# Patient Record
Sex: Female | Born: 2000 | Race: White | Hispanic: No | Marital: Single | Smoking: Never smoker
Health system: Southern US, Community
[De-identification: ages and names within clinical notes are randomized; demographics above are authoritative.]

---
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
## 2019-03-12 ENCOUNTER — Other Ambulatory Visit: Payer: Self-pay | Admitting: Family Medicine

## 2019-03-12 ENCOUNTER — Ambulatory Visit
Admission: RE | Admit: 2019-03-12 | Discharge: 2019-03-12 | Disposition: A | Discharge status: Home / Self Care | Payer: BC Managed Care – PPO | Source: Ambulatory Visit | Attending: Family Medicine | Admitting: Family Medicine

## 2019-03-12 ENCOUNTER — Other Ambulatory Visit: Payer: Self-pay

## 2019-03-12 DIAGNOSIS — M25562 Pain in left knee: Secondary | ICD-10-CM

## 2019-03-12 DIAGNOSIS — M25462 Effusion, left knee: Secondary | ICD-10-CM

## 2019-03-14 ENCOUNTER — Other Ambulatory Visit: Payer: Self-pay

## 2019-03-14 ENCOUNTER — Ambulatory Visit
Admission: RE | Admit: 2019-03-14 | Discharge: 2019-03-14 | Disposition: A | Discharge status: Home / Self Care | Payer: BC Managed Care – PPO | Source: Ambulatory Visit | Attending: Family Medicine | Admitting: Family Medicine

## 2019-03-14 DIAGNOSIS — M25462 Effusion, left knee: Secondary | ICD-10-CM

## 2019-03-14 DIAGNOSIS — M25562 Pain in left knee: Secondary | ICD-10-CM | POA: Diagnosis present

## 2019-03-19 ENCOUNTER — Other Ambulatory Visit: Payer: Self-pay

## 2019-03-19 ENCOUNTER — Other Ambulatory Visit (INDEPENDENT_AMBULATORY_CARE_PROVIDER_SITE_OTHER): Payer: BC Managed Care – PPO

## 2019-03-19 DIAGNOSIS — T148XXA Other injury of unspecified body region, initial encounter: Secondary | ICD-10-CM

## 2019-03-19 DIAGNOSIS — M898X9 Other specified disorders of bone, unspecified site: Secondary | ICD-10-CM

## 2019-03-20 LAB — COMPREHENSIVE METABOLIC PANEL
ALT: 17 IU/L (ref 0–32)
AST: 25 IU/L (ref 0–40)
Albumin/Globulin Ratio: 2.1 (ref 1.2–2.2)
Albumin: 4.6 g/dL (ref 3.9–5.0)
Alkaline Phosphatase: 72 IU/L (ref 39–117)
BUN/Creatinine Ratio: 16 (ref 9–23)
BUN: 13 mg/dL (ref 6–20)
Bilirubin Total: 0.4 mg/dL (ref 0.0–1.2)
CO2: 23 mmol/L (ref 20–29)
Calcium: 9.2 mg/dL (ref 8.7–10.2)
Chloride: 103 mmol/L (ref 96–106)
Creatinine, Ser: 0.79 mg/dL (ref 0.57–1.00)
GFR calc Af Amer: 125 mL/min/{1.73_m2} (ref >59)
GFR calc non Af Amer: 109 mL/min/{1.73_m2} (ref >59)
Globulin, Total: 2.2 g/dL (ref 1.5–4.5)
Glucose: 92 mg/dL (ref 65–99)
Potassium: 4.4 mmol/L (ref 3.5–5.2)
Sodium: 139 mmol/L (ref 134–144)
Total Protein: 6.8 g/dL (ref 6.0–8.5)

## 2019-03-20 LAB — CBC WITH DIFFERENTIAL/PLATELET
Basophils Absolute: 0 10*3/uL (ref 0.0–0.2)
Basos: 1 %
EOS (ABSOLUTE): 0.1 10*3/uL (ref 0.0–0.4)
Eos: 1 %
Hematocrit: 40.4 % (ref 34.0–46.6)
Hemoglobin: 13.2 g/dL (ref 11.1–15.9)
Immature Grans (Abs): 0 10*3/uL (ref 0.0–0.1)
Immature Granulocytes: 0 %
Lymphocytes Absolute: 1.6 10*3/uL (ref 0.7–3.1)
Lymphs: 29 %
MCH: 29.4 pg (ref 26.6–33.0)
MCHC: 32.7 g/dL (ref 31.5–35.7)
MCV: 90 fL (ref 79–97)
Monocytes Absolute: 0.3 10*3/uL (ref 0.1–0.9)
Monocytes: 6 %
Neutrophils Absolute: 3.5 10*3/uL (ref 1.4–7.0)
Neutrophils: 63 %
Platelets: 255 10*3/uL (ref 150–450)
RBC: 4.49 x10E6/uL (ref 3.77–5.28)
RDW: 12.5 % (ref 11.7–15.4)
WBC: 5.5 10*3/uL (ref 3.4–10.8)

## 2019-03-20 LAB — IRON AND TIBC
Iron Saturation: 78 % (ref 15–55)
Iron: 246 ug/dL — ABNORMAL HIGH (ref 27–159)
Total Iron Binding Capacity: 315 ug/dL (ref 250–450)
UIBC: 69 ug/dL — ABNORMAL LOW (ref 131–425)

## 2019-03-20 LAB — FERRITIN: Ferritin: 23 ng/mL (ref 15–77)

## 2019-03-20 LAB — VITAMIN D 25 HYDROXY (VIT D DEFICIENCY, FRACTURES): Vit D, 25-Hydroxy: 33.8 ng/mL (ref 30.0–100.0)

## 2019-03-22 ENCOUNTER — Telehealth: Payer: Self-pay | Admitting: Family Medicine

## 2019-03-22 NOTE — Telephone Encounter (Signed)
Spoke with the patient and discussed lab results. Vitamin D level was requested by Dr. Ardine Eng and CBCw/diff and iron studies were requested by the patient and her mother.  Patient doesn't take a Vitamin D supplement but does take an iron supplement. She is unsure of the dose she takes. She recently stopped dairy as well.   Patient states that she has already sent the results to her primary care physician back home to review. She plans on having a virtual meeting with her soon. She will discuss any adjustments in her iron supplement and possibly adding a Vitamin D3 Supplement with Ca+ with her PMD. Follow-up as needed if any other labs requested by PMD. Patient in agreement and has no further questions.

## 2020-10-14 ENCOUNTER — Institutional Professional Consult (permissible substitution): Payer: PRIVATE HEALTH INSURANCE | Admitting: Internal Medicine

## 2021-05-21 IMAGING — MR MR KNEE*L* W/O CM
7 series · 40 of 40 positions shown · non-contrast
Comparison: 11/01/2017 MRI.

CLINICAL DATA: Knee trauma. History of ACL reconstruction 1 year
ago with tightness in the knee and limited range of motion after
playing lacrosse.

EXAM:
MRI OF THE LEFT KNEE WITHOUT CONTRAST
TECHNIQUE: Multiplanar, multisequence MR imaging of the knee was performed. No
intravenous contrast was administered.

[Series 8: T2 fat-sat · axial · left · 4.0mm · 0.50mm/px · z∈[-57,+67]mm · 7 of 26 slices shown (1 of 3)]
[im 1/26]
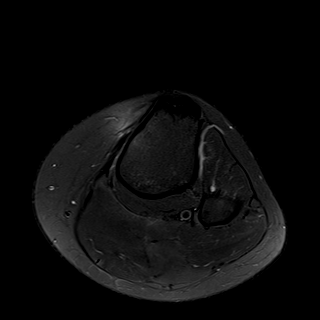
[im 5/26]
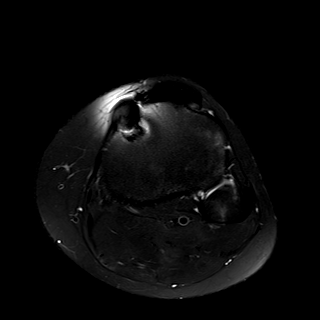
[im 9/26]
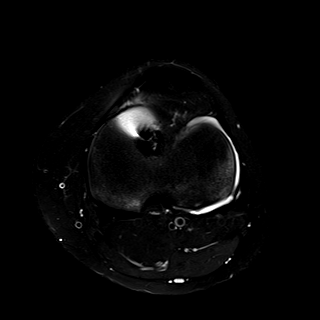
[im 13/26]
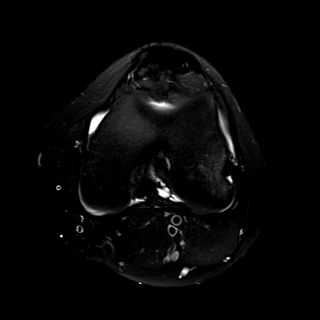
[im 17/26]
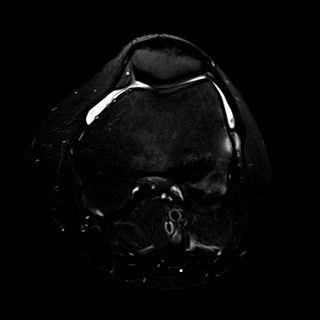
[im 21/26]
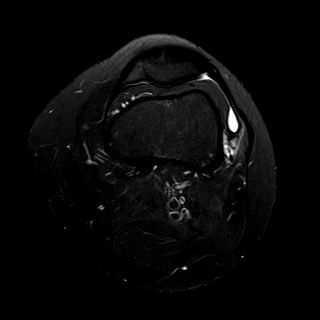
[im 26/26]
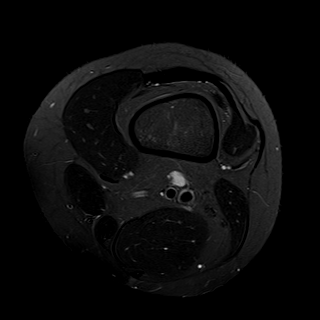

[Series 9: T2 fat-sat · coronal · left · 4.0mm · 0.59mm/px · 7 of 29 slices shown (2 of 3)]
[im 1/29]
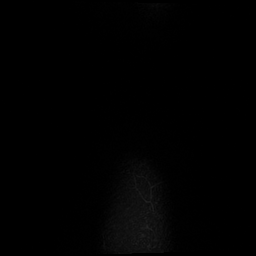
[im 5/29]
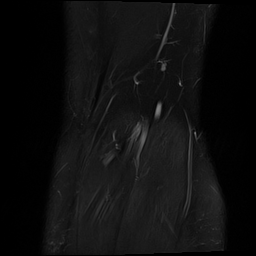
[im 10/29]
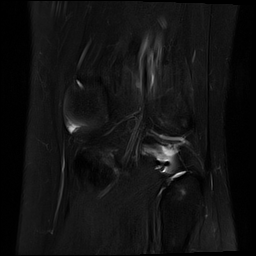
[im 15/29]
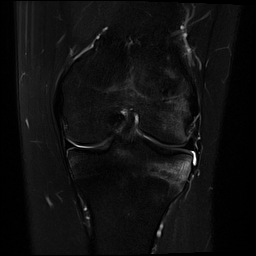
[im 19/29]
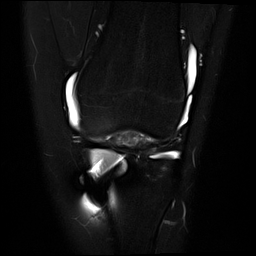
[im 24/29]
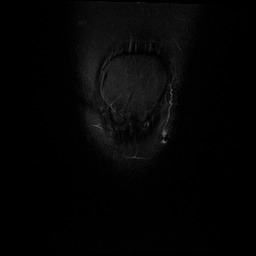
[im 29/29]
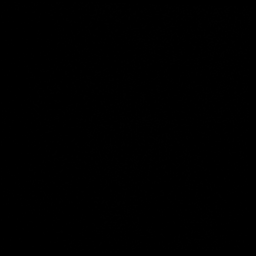

[Series 10: T1 · coronal · left · 4.0mm · 0.59mm/px · 6 of 30 slices shown]
[im 1/30]
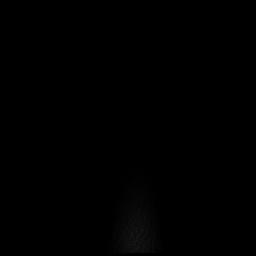
[im 6/30]
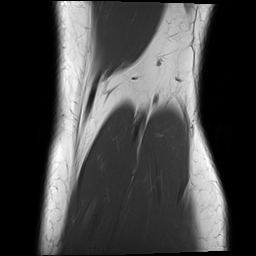
[im 12/30]
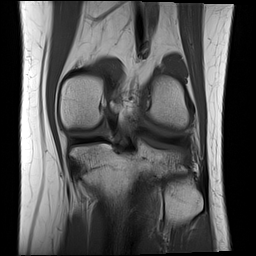
[im 18/30]
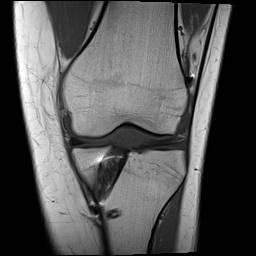
[im 24/30]
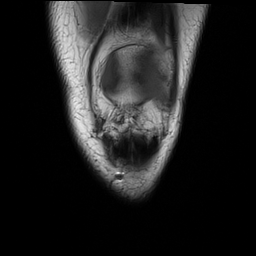
[im 30/30]
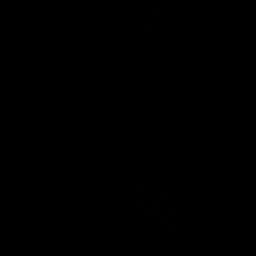

[Series 11: PD fat-sat · sagittal · left · 3.0mm · 0.59mm/px · 6 of 30 slices shown (1 of 2)]
[im 1/30]
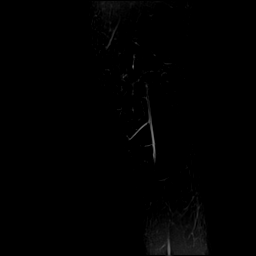
[im 6/30]
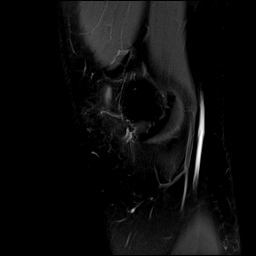
[im 12/30]
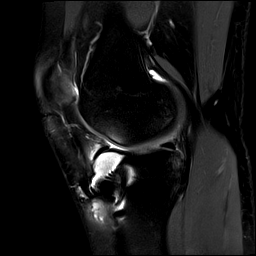
[im 18/30]
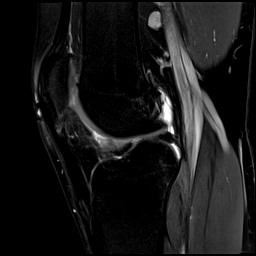
[im 24/30]
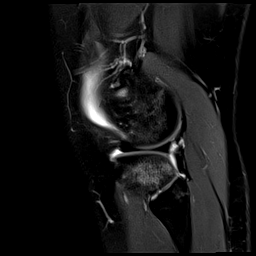
[im 30/30]
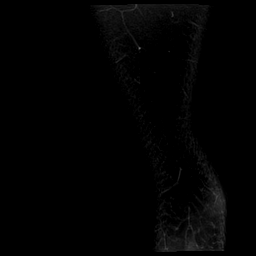

[Series 12: PD fat-sat · coronal · left · 4.0mm · 0.59mm/px · 6 of 30 slices shown (2 of 2)]
[im 1/30]
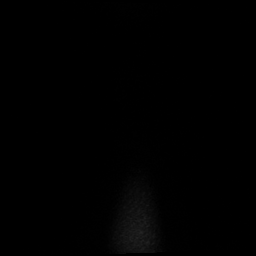
[im 6/30]
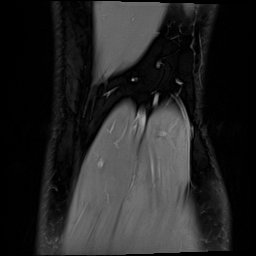
[im 12/30]
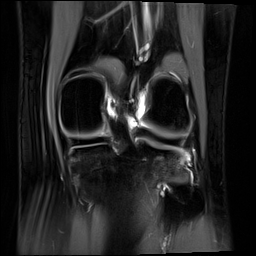
[im 18/30]
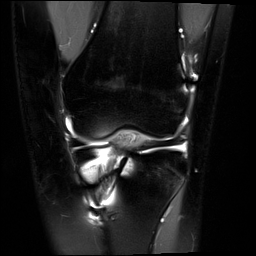
[im 24/30]
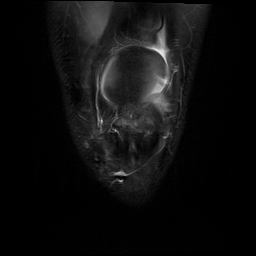
[im 30/30]
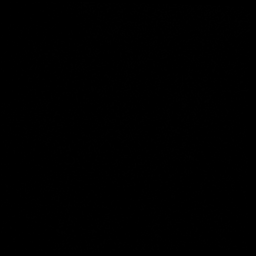

[Series 13: T2 fat-sat · sagittal · left · 3.0mm · 0.59mm/px · 6 of 30 slices shown (3 of 3)]
[im 1/30]
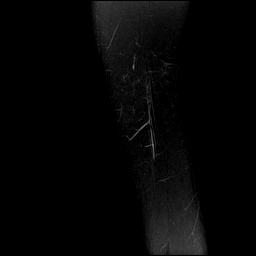
[im 6/30]
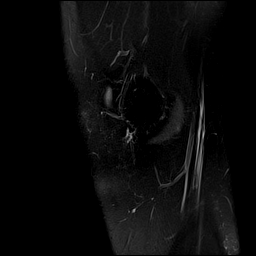
[im 12/30]
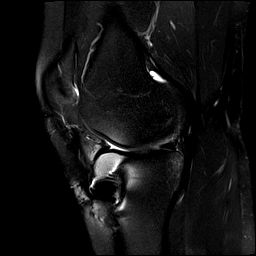
[im 18/30]
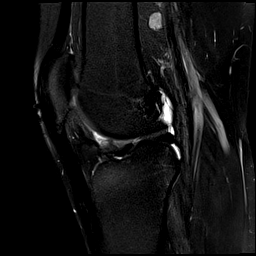
[im 24/30]
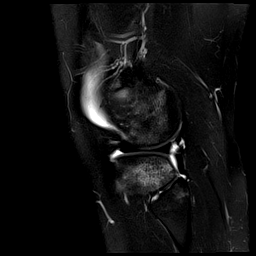
[im 30/30]
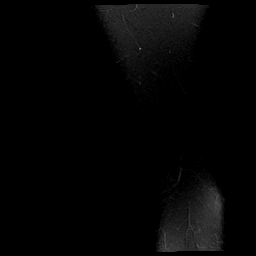

[Series 14: PD · oblique · left · 2.0mm · 0.47mm/px · 2 of 10 slices shown]
[im 1/10]
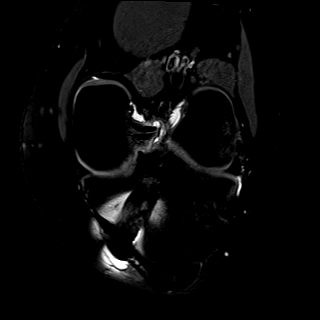
[im 10/10]
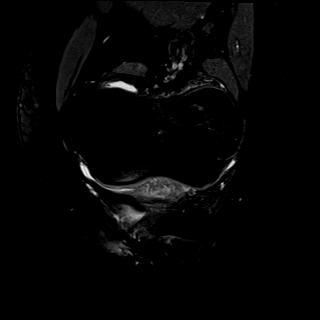

[40 of 40 positions shown; findings below may reference images not displayed]

FINDINGS: MENISCI

Medial meniscus:  Intact

Lateral meniscus:  Intact

LIGAMENTS

Cruciates:  The ACL graft is intact. The PCL is intact.

Collaterals:  Intact

CARTILAGE

Patellofemoral:  Normal articular cartilage.

Medial:  Normal

Lateral:  Normal

Joint:  Small joint effusion.

Popliteal Fossa:  No popliteal mass or Baker's cyst.

Extensor Mechanism: The patella retinacular structures are intact
and the quadriceps and patellar tendons are intact. There is
moderate thickening of the patellar tendon and signal changes
related to prior bone-tendon-bone harvest procedure. I do not see
any evidence of a patellar tendon tear but there appears to be
proximal tendinopathy.

Bones: Moderate-sized bone contusions involving the lateral femoral
condyle and lateral tibia possibly from a direct blow. No fracture
or osteochondral lesion.

Other: Normal knee musculature.
IMPRESSION: 1. Intact ACL graft. The PCL and collateral ligaments are intact.
2. No meniscal tears and intact articular cartilage.
3. Moderate-sized bone contusions involving the lateral femoral
condyle and lateral tibia possibly from a direct blow.
4. Postoperative changes involving the patellar tendon as detailed
above with some proximal tendinopathy.
5. Small joint effusion.
